# Patient Record
Sex: Male | Born: 2000
Health system: Southern US, Community
[De-identification: ages and names within clinical notes are randomized; demographics above are authoritative.]

## PROBLEM LIST (undated history)

## (undated) DIAGNOSIS — R011 Cardiac murmur, unspecified: Secondary | ICD-10-CM

## (undated) DIAGNOSIS — T7840XA Allergy, unspecified, initial encounter: Secondary | ICD-10-CM

## (undated) HISTORY — PX: TYMPANOSTOMY TUBE PLACEMENT: SHX32

---
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
## 2019-02-07 ENCOUNTER — Other Ambulatory Visit: Payer: Self-pay

## 2019-02-07 DIAGNOSIS — Z20822 Contact with and (suspected) exposure to covid-19: Secondary | ICD-10-CM

## 2019-02-08 LAB — NOVEL CORONAVIRUS, NAA: SARS-CoV-2, NAA: NOT DETECTED

## 2019-10-30 ENCOUNTER — Ambulatory Visit: Payer: Self-pay | Attending: Internal Medicine

## 2019-10-30 DIAGNOSIS — Z23 Encounter for immunization: Secondary | ICD-10-CM

## 2019-10-30 NOTE — Progress Notes (Signed)
   Covid-19 Vaccination Clinic  Name:  Patrick Ramirez    MRN: [id number] DOB: September 26, 2000  10/30/2019  [name] was observed post Covid-19 immunization for 15 minutes without incident. He was provided with Vaccine Information Sheet and instruction to access the V-Safe system.   [name] was instructed to call 911 with any severe reactions post vaccine: Marland Kitchen Difficulty breathing  . Swelling of face and throat  . A fast heartbeat  . A bad rash all over body  . Dizziness and weakness   Immunizations Administered    Name Date Dose VIS Date Route   Pfizer COVID-19 Vaccine 10/30/2019 10:42 AM 0.3 mL 05/10/2018 Intramuscular   Manufacturer: [employer], [employer]   Lot: [id number]   NDC: [address]-4

## 2019-11-27 ENCOUNTER — Ambulatory Visit: Payer: Self-pay | Attending: Critical Care Medicine

## 2019-11-27 ENCOUNTER — Ambulatory Visit: Payer: Self-pay

## 2019-11-27 DIAGNOSIS — Z23 Encounter for immunization: Secondary | ICD-10-CM

## 2019-11-27 NOTE — Progress Notes (Signed)
   [facility]ov[facility] Vaccination [facility]  Name:  Patrick Ramirez    MRN: [id number] DOB: 03-Mar-2001  11/27/2019  [name] was observed post Covid-19 immunization for 15 minutes without incident. He was provided with Vaccine Information Sheet and instruction to access the V-Safe system.   [name] was instructed to call 911 with any severe reactions post vaccine: Marland Kitchen Difficulty breathing  . Swelling of face and throat  . A fast heartbeat  . A bad rash all over body  . Dizziness and weakness   Immunizations Administered    Name Date Dose VIS Date Route   Pfizer COVID-19 Vaccine 11/27/2019 10:15 AM 0.3 mL 05/10/2018 Intramuscular   Manufacturer: [employer], [employer]   Lot: [id number]   NDC: [address]-0
